# Patient Record
Sex: Female | Born: 1975 | Hispanic: Yes | Marital: Single | Smoking: Never smoker
Health system: Southern US, Community
[De-identification: ages and names within clinical notes are randomized; demographics above are authoritative.]

## PROBLEM LIST (undated history)

## (undated) DIAGNOSIS — Z789 Other specified health status: Secondary | ICD-10-CM

---

## 1997-09-18 HISTORY — PX: ANKLE SURGERY: SHX546

## 2008-09-18 HISTORY — PX: RHINOPLASTY: SHX2354

## 2011-09-19 HISTORY — PX: OTHER SURGICAL HISTORY: SHX169

## 2013-09-18 NOTE — L&D Delivery Note (Signed)
Delivery Note At 5:40 AM a viable female was delivered via Vaginal, Spontaneous Delivery (Presentation: Right Occiput Anterior).  APGAR: 8, 9; weight .   Placenta status: Intact, Spontaneous.  Cord: 3 vessels with the following complications: None.  Cord pH: N/A  Anesthesia: Epidural  Episiotomy: None Lacerations: 2nd degree Suture Repair: 3.0 vicryl Est. Blood Loss (mL): 300  Mom to postpartum.  Baby to Couplet care / Skin to Skin.  NSVD female delivered over a 2nd degree perineal tear without other complications. Cord blood was collected for banking. Third stage of delivery was managed actively, with pitocin and traction. Her laceration was repaired in the usual fashion with 3.0 vicryl.   [name], Emiel Kielty 11/11/2013, 6:45 AM

## 2013-09-18 NOTE — L&D Delivery Note (Signed)
I have seen the patient with the resident/student and agree with the above.  Hogan, Heather Donovan  

## 2013-10-10 ENCOUNTER — Other Ambulatory Visit: Payer: Self-pay

## 2013-10-10 DIAGNOSIS — Z3201 Encounter for pregnancy test, result positive: Secondary | ICD-10-CM

## 2013-10-10 LAB — POCT URINALYSIS DIP (DEVICE)
BILIRUBIN URINE: NEGATIVE
GLUCOSE, UA: NEGATIVE mg/dL
Hgb urine dipstick: NEGATIVE
KETONES UR: NEGATIVE mg/dL
LEUKOCYTES UA: NEGATIVE
NITRITE: NEGATIVE
Protein, ur: NEGATIVE mg/dL
Specific Gravity, Urine: 1.02 (ref 1.005–1.030)
Urobilinogen, UA: 0.2 mg/dL (ref 0.0–1.0)
pH: 6.5 (ref 5.0–8.0)

## 2013-10-11 LAB — OBSTETRIC PANEL
ANTIBODY SCREEN: NEGATIVE
BASOS ABS: 0 10*3/uL (ref 0.0–0.1)
BASOS PCT: 0 % (ref 0–1)
Eosinophils Absolute: 0.1 10*3/uL (ref 0.0–0.7)
Eosinophils Relative: 2 % (ref 0–5)
HCT: 40.1 % (ref 36.0–46.0)
HEP B S AG: NEGATIVE
Hemoglobin: 13.9 g/dL (ref 12.0–15.0)
LYMPHS PCT: 21 % (ref 12–46)
Lymphs Abs: 1.9 10*3/uL (ref 0.7–4.0)
MCH: 31.7 pg (ref 26.0–34.0)
MCHC: 34.7 g/dL (ref 30.0–36.0)
MCV: 91.3 fL (ref 78.0–100.0)
MONO ABS: 0.5 10*3/uL (ref 0.1–1.0)
MONOS PCT: 5 % (ref 3–12)
NEUTROS ABS: 6.3 10*3/uL (ref 1.7–7.7)
NEUTROS PCT: 72 % (ref 43–77)
Platelets: 261 10*3/uL (ref 150–400)
RBC: 4.39 MIL/uL (ref 3.87–5.11)
RDW: 13.8 % (ref 11.5–15.5)
RUBELLA: 16.9 {index} — AB (ref <0.90)
Rh Type: POSITIVE
WBC: 8.8 10*3/uL (ref 4.0–10.5)

## 2013-10-11 LAB — HIV ANTIBODY (ROUTINE TESTING W REFLEX): HIV: NONREACTIVE

## 2013-10-11 LAB — GLUCOSE TOLERANCE, 1 HOUR (50G) W/O FASTING: GLUCOSE 1 HOUR GTT: 130 mg/dL (ref 70–140)

## 2013-10-13 LAB — PRESCRIPTION MONITORING PROFILE (19 PANEL)
AMPHETAMINE/METH: NEGATIVE ng/mL
BUPRENORPHINE, URINE: NEGATIVE ng/mL
Barbiturate Screen, Urine: NEGATIVE ng/mL
Benzodiazepine Screen, Urine: NEGATIVE ng/mL
CANNABINOID SCRN UR: NEGATIVE ng/mL
CARISOPRODOL, URINE: NEGATIVE ng/mL
COCAINE METABOLITES: NEGATIVE ng/mL
CREATININE, URINE: 136.14 mg/dL (ref >20.0)
ECSTASY: NEGATIVE ng/mL
FENTANYL URINE: NEGATIVE ng/mL
MEPERIDINE UR: NEGATIVE ng/mL
METHADONE SCREEN, URINE: NEGATIVE ng/mL
METHAQUALONE SCREEN (URINE): NEGATIVE ng/mL
Nitrites, Initial: NEGATIVE ug/mL
OXYCODONE SCRN UR: NEGATIVE ng/mL
Opiate Screen, Urine: NEGATIVE ng/mL
PHENCYCLIDINE, UR: NEGATIVE ng/mL
Propoxyphene: NEGATIVE ng/mL
Tapentadol, urine: NEGATIVE ng/mL
Tramadol Scrn, Ur: NEGATIVE ng/mL
Zolpidem, Urine: NEGATIVE ng/mL
pH, Initial: 6.3 pH (ref 4.5–8.9)

## 2013-10-14 ENCOUNTER — Emergency Department (HOSPITAL_COMMUNITY)
Admission: EM | Admit: 2013-10-14 | Discharge: 2013-10-14 | Disposition: A | Discharge status: Home / Self Care | Payer: 59 | Source: Home / Self Care | Attending: Family Medicine | Admitting: Family Medicine

## 2013-10-14 ENCOUNTER — Encounter (HOSPITAL_COMMUNITY): Payer: Self-pay | Admitting: Emergency Medicine

## 2013-10-14 DIAGNOSIS — J069 Acute upper respiratory infection, unspecified: Secondary | ICD-10-CM

## 2013-10-14 DIAGNOSIS — B9789 Other viral agents as the cause of diseases classified elsewhere: Principal | ICD-10-CM

## 2013-10-14 LAB — CULTURE, OB URINE: Colony Count: >=100000

## 2013-10-14 LAB — HEMOGLOBINOPATHY EVALUATION
Hemoglobin Other: 0 %
Hgb A2 Quant: 2.6 % (ref 2.2–3.2)
Hgb A: 96.9 % (ref 96.8–97.8)
Hgb F Quant: 0.5 % (ref 0.0–2.0)
Hgb S Quant: 0 %

## 2013-10-14 LAB — POCT RAPID STREP A: STREPTOCOCCUS, GROUP A SCREEN (DIRECT): NEGATIVE

## 2013-10-14 MED ORDER — HYDROCODONE-ACETAMINOPHEN 5-325 MG PO TABS: 0.5000 | ORAL_TABLET | Freq: Every evening | ORAL | Status: DC | PRN

## 2013-10-14 NOTE — ED Provider Notes (Signed)
Sydney Lam is a 38 y.o. female who presents to Urgent Care today for 2 days of sore throat coughing congestion. Patient denies any significant shortness of breath nausea vomiting or diarrhea. She is currently about 36 weeks in her pregnancy. She is from [place] and got the majority of her prenatal care in [place]. [facility] February 9th.  She states that her prenatal care was otherwise unremarkable until now.  She feels well otherwise. She has tried only honey with women for her cough which has not helped much.   History reviewed. No pertinent past medical history. History  Substance Use Topics  . Smoking status: Not on file  . Smokeless tobacco: Never Used  . Alcohol Use: No   ROS as above Medications: No current facility-administered medications for this encounter.   Current Outpatient Prescriptions  Medication Sig Dispense Refill  . HYDROcodone-acetaminophen (NORCO/VICODIN) 5-325 MG per tablet Take 0.5 tablets by mouth at bedtime as needed (cough).  6 tablet  0    Exam:  BP 122/67  Pulse 78  Temp(Src) 98.7 F (37.1 C) (Oral)  Resp 16  Wt 158 lb 11.7 oz (72 kg)  SpO2 97% Gen: Well NAD HEENT: EOMI,  MMM, posterior pharynx is mildly erythematous. Tympanic membranes are normal appearing bilaterally Lungs: Normal work of breathing. CTABL Heart: RRR no MRG Abd: NABS, Soft. NT, gravid Exts: Brisk capillary refill, warm and well perfused.   Results for orders placed during the hospital encounter of 10/14/13 (from the past 24 hour(s))  POCT RAPID STREP A ([facility] URG CARE ONLY)     Status: None   Collection Time    10/14/13 12:48 PM      Result Value Range   Streptococcus, Group A Screen (Direct) NEGATIVE  NEGATIVE   No results found.  Assessment and Plan: 38 y.o. female with viral URI with cough. Plan to treat with Tylenol and hydrocodone his cough medication. Continue prenatal vitamins. [facility].  Discussed warning signs or symptoms. Please see discharge instructions. Patient expresses understanding.    Rodolph BongEvan S Stancil Deisher, MD 10/14/13 [phone]

## 2013-10-14 NOTE — ED Notes (Signed)
Pt reports  Symptoms  Of  sorethroat       With  Pain  When  She  Swallows   Also  Has  A  Non   productice cough  As  Well     The pt    Is  Pregnant  No  Pregnancy  Related   Issues

## 2013-10-14 NOTE — Discharge Instructions (Signed)
Gricias por venir hoy.  Take 1/2 tablet of norco at bedtime for cough as needed.  Use tylenol for fever, chills or sore throat.  Call or go to the emergency room if you get worse, have trouble breathing, have chest pains, or palpitations.

## 2013-10-15 ENCOUNTER — Telehealth: Payer: Self-pay | Admitting: *Deleted

## 2013-10-15 NOTE — Telephone Encounter (Addendum)
Message copied by Jill SideAY, Tilly Pernice L on Wed Oct 15, 2013  8:12 AM ------      Message from: Adam PhenixARNOLD, JAMES G      Created: Tue Oct 14, 2013  6:19 PM       UTI, Rx Macrobid 100 mg BID 7 days  ------ I called pt with [employer] [facility] [phone] and left message that her recent urine test shows a bladder infection. [facility]. If she has questions, she may call back on Monday.

## 2013-10-16 LAB — CULTURE, GROUP A STREP

## 2013-10-17 MED ORDER — NITROFURANTOIN MONOHYD MACRO 100 MG PO CAPS: 100.0000 mg | ORAL_CAPSULE | Freq: Two times a day (BID) | ORAL | Status: DC

## 2013-10-20 ENCOUNTER — Encounter: Payer: Self-pay | Admitting: *Deleted

## 2013-10-27 ENCOUNTER — Encounter: Payer: Self-pay | Admitting: Obstetrics & Gynecology

## 2013-10-27 ENCOUNTER — Ambulatory Visit (INDEPENDENT_AMBULATORY_CARE_PROVIDER_SITE_OTHER): Payer: 59 | Admitting: Obstetrics & Gynecology

## 2013-10-27 VITALS — BP 119/73 | Temp 97.8°F | Ht 63.0 in

## 2013-10-27 DIAGNOSIS — Z3400 Encounter for supervision of normal first pregnancy, unspecified trimester: Secondary | ICD-10-CM | POA: Insufficient documentation

## 2013-10-27 DIAGNOSIS — Z23 Encounter for immunization: Secondary | ICD-10-CM

## 2013-10-27 LAB — POCT URINALYSIS DIP (DEVICE)
BILIRUBIN URINE: NEGATIVE
Glucose, UA: NEGATIVE mg/dL
Ketones, ur: NEGATIVE mg/dL
Nitrite: NEGATIVE
PH: 7 (ref 5.0–8.0)
Protein, ur: NEGATIVE mg/dL
Specific Gravity, Urine: 1.02 (ref 1.005–1.030)
Urobilinogen, UA: 0.2 mg/dL (ref 0.0–1.0)

## 2013-10-27 LAB — OB RESULTS CONSOLE GC/CHLAMYDIA
CHLAMYDIA, DNA PROBE: NEGATIVE
Gonorrhea: NEGATIVE

## 2013-10-27 LAB — OB RESULTS CONSOLE GBS: GBS: NEGATIVE

## 2013-10-27 MED ORDER — TETANUS-DIPHTH-ACELL PERTUSSIS 5-2.5-18.5 LF-MCG/0.5 IM SUSP: 0.5000 mL | Freq: Once | INTRAMUSCULAR | Status: DC

## 2013-10-27 NOTE — Patient Instructions (Signed)
Parto vaginal (Vaginal Delivery) Durante el parto, el mdico la ayudar a dar a luz a su beb. En el parto vaginal, deber pujar para que el beb salga por la vagina. Sin embargo, antes de que pueda sacar al beb, es necesario que ocurran ciertas cosas. La abertura del tero (cuello del tero) tiene que ablandarse, hacerse ms delgado y abrirse (dilatar) hasta que llegue a 10 cm. Adems, el beb tiene que bajar desde el tero a la vagina.  SIGNOS DE TRABAJO DE PARTO  El mdico tendr primero que asegurarse de que usted est en trabajo de parto. Algunos signos son:   Eliminar lo que se llama tapn mucoso antes del inicio del trabajo de parto. Este es una pequea cantidad de mucosidad teida con sangre.  Tener contracciones uterinas regulares y dolorosas.  El tiempo entre las contracciones debe acortarse.  Las molestias y el dolor se harn ms intensos gradualmente.  El dolor de las contracciones empeora al caminar y no se alivia con el reposo.  El cuello del tero se hace mas delgado (se borra) y se dilata. ANTES DEL PARTO Una vez que se inicie el trabajo de parto y sea admitida en el hospital o sanatorio, el mdico podr hacer lo siguiente:   Realizar un examen fsico.  Controlar si hay complicaciones relacionadas con el trabajo de parto.  Verificar su presin arterial, temperatura y pulso y la frecuencia cardaca (signos vitales).  Determinar si se ha roto el saco amnitico y cundo ha ocurrido.  Realizar un examen vaginal (utilizando un guante estril y un lubricante) para determinar:  La posicin (presentacin) del beb. El beb se presenta con la cabeza primero (vertex) en el canal de parto (vagina), o estn los pies o las nalgas primero (de nalgas)?  El nivel (estacin) de la cabeza del beb dentro del canal de parto.  El borramiento y la dilatacin del cuello uterino.  El monitor fetal electrnico generalmente se coloca sobre el abdomen al llegar. Se utiliza para controlar  las contracciones y la frecuencia cardaca del beb.  Cuando el monitor est en el abdomen (monitor fetal externo), slo toma la frecuencia y la duracin de las contracciones. No informa acerca de la intensidad de las contracciones.  Si el mdico necesita saber exactamente la intensidad de las contracciones o cul es la frecuencia cardaca del beb, colocar un monitor interno en la vagina y el tero. El mdico comentar los riesgos y los beneficios de usar un monitor interno y le pedir autorizacin antes de colocar el dispositivo.  El monitoreo fetal continuo ser necesario si le han aplicado una epidural, si le administran ciertos medicamentos (como oxitocina) y si tiene complicaciones del embarazo o del trabajo de parto.  Podrn colocarle una va intravenosa en una vena del brazo para suministrarle lquidos y medicamentos, si es necesario. TRES ETAPAS DEL TRABAJO DE PARTO Y EL PARTO El trabajo de parto y el parto normales se dividen en tres etapas. Primera etapa Esta etapa comienza cuando comienzan las contracciones regulares y el cuello comienza a borrarse y dilatarse. Finaliza cuando el cuello est completamente abierto (completamente dilatado). La primera etapa es la etapa ms larga del trabajo de parto y puede durar desde 3 horas a 15 horas.  Algunos mtodos estn disponibles para ayudar con el dolor del parto. Usted y su mdico decidirn qu opcin es la mejor para usted. Las opciones incluyen:   Medicamentos narcticos. Estos son medicamentos fuertes que usted puede recibir a travs de una va intravenosa o como   inyeccin en el msculo. Estos medicamentos alivian el dolor pero no hacen que desaparezca completamente.  Epidural. Se administra un medicamento a travs de un tubo delgado que se inserta en la espalda. El medicamento adormece la parte inferior del cuerpo y evita el dolor en esa zona.  Bloqueo paracervical Es una inyeccin de un anestsico en cada lado del cuello  uterino.  Usted podr pedir un parto natural, que implica que no se usen analgsicos ni epidural durante el parto y el trabajo de parto. En cambio, podr tener otro tipo de ayuda como ejercicios respiratorios para hacer frente al dolor. Segunda etapa La segunda etapa del trabajo de parto comienza cuando el cuello se ha dilatado completamente a 10 cm. Contina hasta que usted puja al beb hacia abajo, por el canal de parto, y el beb nace. Esta etapa puede durar slo algunos minutos o algunas horas.  La posicin del la cabeza del beb a medida que pasa por el canal de parto, es informada como un nmero, llamado estacin. Si la cabeza del beb no ha iniciado su descenso, la estacin se describe como que est en menos 3 ( 3). Cuando la cabeza del beb est en la estacin cero, est en el medio del canal de parto y se encaja en la pelvis. La estacin en la que se encuentra el beb indica el progreso de la segunda etapa del trabajo de parto.  Cuando el beb nace, el mdico lo sostendr con la cabeza hacia abajo para evitar que el lquido amnitico, el moco y la sangre entren en los pulmones del beb. La boca y la nariz del beb podrn ser succionadas con un pequeo bulbo para retirar todo lquido adicional.  El mdico podr colocar al beb sobre su estmago. Es importante evitar que el beb tome fro. Para hacerlo, el mdico secar al beb, lo colocar directamente sobre su piel, (sin mantas entre usted y el beb) y lo cubrir con mantas secas y tibias.  Se corta el cordn umbilical. Tercera etapa Durante la tercera etapa del trabajo de parto, el mdico sacar la placenta (alumbramiento) y se asegurar de que el sangrado est controlado. La salida de la placenta generalmente demora 5 minutos pero puede tardar hasta 30 minutos. Luego de la salida de la placenta, le darn un medicamento por va intravenosa o inyectable para ayudar a contraer el tero y controlar el sangrado. Si planea amamantar al beb, puede  intentar en este momento. Luego de la salida de la placenta, el tero debe contraerse y quedar muy firme. Si el tero no queda firme, el mdico lo masajear. Esto es importante debido a que la contraccin del tero ayuda a cortar el sangrado en el sitio en que la placenta estaba unida al tero. Si el tero no se contrae adecuadamente ni permanece firme, podr causar un sangrado abundante. Si hay mucho sangrado, podrn darle medicamentos para contraer el tero y detener el sangrado.  Document Released: 08/17/2008 Document Revised: 05/07/2013 [employer] Patient Information 2014 [employer].  

## 2013-10-27 NOTE — Progress Notes (Signed)
Transfer care from [place], had prenatal screening and US nl. No complaints today, exam normal, CT and GC and GBS done today.

## 2013-10-27 NOTE — Progress Notes (Signed)
Pulse- 63 Weight gain of 25-35lbs New ob packet given

## 2013-10-28 LAB — GC/CHLAMYDIA PROBE AMP
CT Probe RNA: NEGATIVE
GC Probe RNA: NEGATIVE

## 2013-10-29 LAB — CULTURE, BETA STREP (GROUP B ONLY)

## 2013-11-05 ENCOUNTER — Ambulatory Visit (INDEPENDENT_AMBULATORY_CARE_PROVIDER_SITE_OTHER): Payer: Managed Care, Other (non HMO) | Admitting: Advanced Practice Midwife

## 2013-11-05 VITALS — BP 111/77 | Temp 97.3°F | Wt 171.0 lb

## 2013-11-05 DIAGNOSIS — Z3400 Encounter for supervision of normal first pregnancy, unspecified trimester: Secondary | ICD-10-CM

## 2013-11-05 LAB — POCT URINALYSIS DIP (DEVICE)
BILIRUBIN URINE: NEGATIVE
Glucose, UA: NEGATIVE mg/dL
HGB URINE DIPSTICK: NEGATIVE
Ketones, ur: NEGATIVE mg/dL
Leukocytes, UA: NEGATIVE
NITRITE: NEGATIVE
PROTEIN: NEGATIVE mg/dL
Specific Gravity, Urine: 1.01 (ref 1.005–1.030)
Urobilinogen, UA: 0.2 mg/dL (ref 0.0–1.0)
pH: 6.5 (ref 5.0–8.0)

## 2013-11-05 MED ORDER — PSEUDOEPHEDRINE HCL 60 MG PO TABS: 60.0000 mg | ORAL_TABLET | ORAL | Status: DC | PRN

## 2013-11-05 NOTE — Progress Notes (Signed)
Doing well.  Good fetal movement, denies vaginal bleeding, LOF, regular contractions.  Labor precautions given. Pt reports URI with stuffy nose x5 days.  Sudafed 60 mg Q4-6 hours PRN.

## 2013-11-05 NOTE — Progress Notes (Signed)
p=72

## 2013-11-09 ENCOUNTER — Inpatient Hospital Stay (HOSPITAL_COMMUNITY): Payer: Managed Care, Other (non HMO)

## 2013-11-09 ENCOUNTER — Inpatient Hospital Stay (HOSPITAL_COMMUNITY)
Admission: AD | Admit: 2013-11-09 | Discharge: 2013-11-12 | DRG: 775 | Disposition: A | Discharge status: Home / Self Care | Payer: Managed Care, Other (non HMO) | Source: Ambulatory Visit | Attending: Obstetrics and Gynecology | Admitting: Obstetrics and Gynecology

## 2013-11-09 ENCOUNTER — Encounter (HOSPITAL_COMMUNITY): Payer: Self-pay | Admitting: *Deleted

## 2013-11-09 DIAGNOSIS — O4103X0 Oligohydramnios, third trimester, not applicable or unspecified: Secondary | ICD-10-CM

## 2013-11-09 DIAGNOSIS — O4100X0 Oligohydramnios, unspecified trimester, not applicable or unspecified: Secondary | ICD-10-CM | POA: Diagnosis present

## 2013-11-09 DIAGNOSIS — O09529 Supervision of elderly multigravida, unspecified trimester: Secondary | ICD-10-CM | POA: Diagnosis present

## 2013-11-09 DIAGNOSIS — O368190 Decreased fetal movements, unspecified trimester, not applicable or unspecified: Principal | ICD-10-CM | POA: Diagnosis present

## 2013-11-09 HISTORY — DX: Other specified health status: Z78.9

## 2013-11-10 ENCOUNTER — Inpatient Hospital Stay (HOSPITAL_COMMUNITY): Payer: Managed Care, Other (non HMO) | Admitting: Anesthesiology

## 2013-11-10 ENCOUNTER — Encounter (HOSPITAL_COMMUNITY): Payer: Self-pay | Admitting: Family Medicine

## 2013-11-10 ENCOUNTER — Encounter (HOSPITAL_COMMUNITY): Payer: Managed Care, Other (non HMO) | Admitting: Anesthesiology

## 2013-11-10 DIAGNOSIS — O4100X0 Oligohydramnios, unspecified trimester, not applicable or unspecified: Secondary | ICD-10-CM

## 2013-11-10 DIAGNOSIS — O368190 Decreased fetal movements, unspecified trimester, not applicable or unspecified: Secondary | ICD-10-CM

## 2013-11-10 LAB — RPR: RPR: NONREACTIVE

## 2013-11-10 LAB — CBC
HCT: 36.8 % (ref 36.0–46.0)
HEMOGLOBIN: 13.3 g/dL (ref 12.0–15.0)
MCH: 32.4 pg (ref 26.0–34.0)
MCHC: 36.1 g/dL — ABNORMAL HIGH (ref 30.0–36.0)
MCV: 89.8 fL (ref 78.0–100.0)
Platelets: 180 10*3/uL (ref 150–400)
RBC: 4.1 MIL/uL (ref 3.87–5.11)
RDW: 12.9 % (ref 11.5–15.5)
WBC: 9.7 10*3/uL (ref 4.0–10.5)

## 2013-11-10 LAB — TYPE AND SCREEN
ABO/RH(D): O POS
Antibody Screen: NEGATIVE

## 2013-11-10 LAB — ABO/RH: ABO/RH(D): O POS

## 2013-11-10 MED ORDER — FENTANYL 2.5 MCG/ML BUPIVACAINE 1/10 % EPIDURAL INFUSION (WH - ANES)
14.0000 mL/h | INTRAMUSCULAR | Status: DC | PRN
Start: 2013-11-10 — End: 2013-11-11
  Filled 2013-11-10: qty 125

## 2013-11-10 MED ORDER — FENTANYL 2.5 MCG/ML BUPIVACAINE 1/10 % EPIDURAL INFUSION (WH - ANES)
INTRAMUSCULAR | Status: DC | PRN
  Administered 2013-11-10: 14 mL/h via EPIDURAL

## 2013-11-10 MED ORDER — EPHEDRINE 5 MG/ML INJ
10.0000 mg | INTRAVENOUS | Status: DC | PRN
  Filled 2013-11-10: qty 2

## 2013-11-10 MED ORDER — OXYTOCIN 40 UNITS IN LACTATED RINGERS INFUSION - SIMPLE MED
62.5000 mL/h | INTRAVENOUS | Status: DC
  Administered 2013-11-11: 62.5 mL/h via INTRAVENOUS
  Filled 2013-11-10: qty 1000

## 2013-11-10 MED ORDER — DIPHENHYDRAMINE HCL 50 MG/ML IJ SOLN: 12.5000 mg | INTRAMUSCULAR | Status: DC | PRN

## 2013-11-10 MED ORDER — SALINE SPRAY 0.65 % NA SOLN
1.0000 | NASAL | Status: DC | PRN
  Filled 2013-11-10: qty 44

## 2013-11-10 MED ORDER — LACTATED RINGERS IV SOLN
500.0000 mL | Freq: Once | INTRAVENOUS | Status: AC
  Administered 2013-11-10: 500 mL via INTRAVENOUS

## 2013-11-10 MED ORDER — LACTATED RINGERS IV SOLN
INTRAVENOUS | Status: DC
  Administered 2013-11-10 (×4): via INTRAVENOUS

## 2013-11-10 MED ORDER — PHENYLEPHRINE 40 MCG/ML (10ML) SYRINGE FOR IV PUSH (FOR BLOOD PRESSURE SUPPORT)
80.0000 ug | PREFILLED_SYRINGE | INTRAVENOUS | Status: DC | PRN
  Filled 2013-11-10: qty 2
  Filled 2013-11-10: qty 10

## 2013-11-10 MED ORDER — EPHEDRINE 5 MG/ML INJ
10.0000 mg | INTRAVENOUS | Status: DC | PRN
  Filled 2013-11-10: qty 2
  Filled 2013-11-10: qty 4

## 2013-11-10 MED ORDER — FAMOTIDINE 20 MG PO TABS
20.0000 mg | ORAL_TABLET | Freq: Two times a day (BID) | ORAL | Status: DC | PRN
  Administered 2013-11-10: 20 mg via ORAL
  Filled 2013-11-10: qty 1

## 2013-11-10 MED ORDER — LACTATED RINGERS IV SOLN: 500.0000 mL | INTRAVENOUS | Status: DC | PRN

## 2013-11-10 MED ORDER — CITRIC ACID-SODIUM CITRATE 334-500 MG/5ML PO SOLN
30.0000 mL | ORAL | Status: DC | PRN
Start: 2013-11-10 — End: 2013-11-11
  Administered 2013-11-11: 30 mL via ORAL
  Filled 2013-11-10: qty 15

## 2013-11-10 MED ORDER — IBUPROFEN 600 MG PO TABS
600.0000 mg | ORAL_TABLET | Freq: Four times a day (QID) | ORAL | Status: DC | PRN
  Administered 2013-11-11: 600 mg via ORAL
  Filled 2013-11-10: qty 1

## 2013-11-10 MED ORDER — LIDOCAINE HCL (PF) 1 % IJ SOLN
30.0000 mL | INTRAMUSCULAR | Status: DC | PRN
  Filled 2013-11-10: qty 30

## 2013-11-10 MED ORDER — PSEUDOEPHEDRINE HCL 30 MG PO TABS
30.0000 mg | ORAL_TABLET | Freq: Four times a day (QID) | ORAL | Status: DC | PRN
  Administered 2013-11-10: 30 mg via ORAL
  Filled 2013-11-10: qty 1

## 2013-11-10 MED ORDER — TERBUTALINE SULFATE 1 MG/ML IJ SOLN: 0.2500 mg | Freq: Once | INTRAMUSCULAR | Status: AC | PRN

## 2013-11-10 MED ORDER — LIDOCAINE HCL (PF) 1 % IJ SOLN
INTRAMUSCULAR | Status: DC | PRN
  Administered 2013-11-10 (×2): 4 mL

## 2013-11-10 MED ORDER — FENTANYL CITRATE 0.05 MG/ML IJ SOLN
50.0000 ug | INTRAMUSCULAR | Status: DC | PRN
  Administered 2013-11-10 (×3): 50 ug via INTRAVENOUS
  Filled 2013-11-10 (×2): qty 2

## 2013-11-10 MED ORDER — ACETAMINOPHEN 325 MG PO TABS: 650.0000 mg | ORAL_TABLET | ORAL | Status: DC | PRN

## 2013-11-10 MED ORDER — FENTANYL CITRATE 0.05 MG/ML IJ SOLN
100.0000 ug | INTRAMUSCULAR | Status: DC | PRN
  Administered 2013-11-10: 100 ug via INTRAVENOUS
  Filled 2013-11-10 (×2): qty 2

## 2013-11-10 MED ORDER — ONDANSETRON HCL 4 MG/2ML IJ SOLN
4.0000 mg | Freq: Four times a day (QID) | INTRAMUSCULAR | Status: DC | PRN
  Administered 2013-11-10: 4 mg via INTRAVENOUS
  Filled 2013-11-10: qty 2

## 2013-11-10 MED ORDER — OXYTOCIN BOLUS FROM INFUSION
500.0000 mL | INTRAVENOUS | Status: DC
  Administered 2013-11-11: 500 mL via INTRAVENOUS

## 2013-11-10 MED ORDER — OXYCODONE-ACETAMINOPHEN 5-325 MG PO TABS: 1.0000 | ORAL_TABLET | ORAL | Status: DC | PRN

## 2013-11-10 MED ORDER — OXYTOCIN 40 UNITS IN LACTATED RINGERS INFUSION - SIMPLE MED
1.0000 m[IU]/min | INTRAVENOUS | Status: DC
  Administered 2013-11-10: 2 m[IU]/min via INTRAVENOUS
  Filled 2013-11-10: qty 1000

## 2013-11-10 MED ORDER — PHENYLEPHRINE 40 MCG/ML (10ML) SYRINGE FOR IV PUSH (FOR BLOOD PRESSURE SUPPORT)
80.0000 ug | PREFILLED_SYRINGE | INTRAVENOUS | Status: DC | PRN
  Filled 2013-11-10: qty 2

## 2013-11-10 NOTE — Progress Notes (Signed)
Sydney Lam is a 38 y.o. G2P0010 at 7568w6d by LMP admitted for induction of labor due to oligohydramnios.  Subjective: Notes worsening contractions, now rates them 6/10. Fentanyl helping.   Objective: BP 118/72  Pulse 67  Temp(Src) 97.6 F (36.4 C) (Oral)  Resp 16  Ht 5\' 3"  (1.6 m)  Wt 78.019 kg (172 lb)  BMI 30.48 kg/m2  LMP 02/04/2013      FHT:  FHR: 120 bpm, variability: moderate,  accelerations:  Present,  decelerations:  Absent UC:   irregular, every 2-6 minutes SVE:   Dilation: 1 Effacement (%): 20 Station: -3 Exam by:: Dr. Ike Benedom  Labs: Lab Results  Component Value Date   WBC 9.7 11/10/2013   HGB 13.3 11/10/2013   HCT 36.8 11/10/2013   MCV 89.8 11/10/2013   PLT 180 11/10/2013    Assessment / Plan: Induction of labor due to oligohydramnios  Labor: Inducing, foley bulb still in place, having contractions so no pit yet, Contractions mostly q 2-3 minutes Preeclampsia:  n/a Fetal Wellbeing:  Category I Pain Control:  Fentanyl I/D:  GBS neg Anticipated MOD:  NSVD  [name], Samuel 11/10/2013, 5:40 AM  I spoke with and examined patient and agree with resident's note and plan of care.  Tawana ScaleMichael Ryan Elizabet Schweppe, MD OB Fellow 11/10/2013 9:06 AM

## 2013-11-10 NOTE — Progress Notes (Signed)
Sydney Lam is a 38 y.o. G2P0010 at 7611w6d  admitted for induction of labor due to Low amniotic fluid..  Subjective:   Objective: BP 120/55  Pulse 63  Temp(Src) 98 F (36.7 C) (Oral)  Resp 18  Ht 5\' 3"  (1.6 m)  Wt 78.019 kg (172 lb)  BMI 30.48 kg/m2  LMP 02/04/2013      FHT:  FHR: 145 bpm, variability: moderate,  accelerations:  Present,  decelerations:  Absent UC:   irregular, every 6-4 minutes SVE:   Dilation: 5 Effacement (%): 80 Station: -2 Exam by:: State FarmHeather Hogan  Labs: Lab Results  Component Value Date   WBC 9.7 11/10/2013   HGB 13.3 11/10/2013   HCT 36.8 11/10/2013   MCV 89.8 11/10/2013   PLT 180 11/10/2013    Assessment / Plan: Induction of labor due to oligohydramnios ,  progressing well on pitocin  Labor: Progressing on Pitocin, will continue to increase then AROM Preeclampsia:  no signs or symptoms of toxicity Fetal Wellbeing:  Category I Pain Control:  Labor support without medications I/D:  n/a Anticipated MOD:  NSVD  Tawnya CrookHogan, Heather Donovan 11/10/2013, 9:46 PM

## 2013-11-10 NOTE — Progress Notes (Addendum)
Patient ID: Sydney Lam, female   DOB: [birth year], 38 y.o.   MRN: [id number] Sydney Lam is a 38 y.o. G2P0010 at 3684w6d admitted for IOL indicated by oligohydramnios, AFI 4.9, s/p FB and on pitocin at 10 mu/hr  Subjective: Comfortable. UCs mild.  Objective: BP 106/57  Pulse 70  Temp(Src) 97.9 F (36.6 C) (Axillary)  Resp 16  Ht 5\' 3"  (1.6 m)  Wt 78.019 kg (172 lb)  BMI 30.48 kg/m2  LMP 02/04/2013  Fetal Heart FHR: 125-130 bpm, variability: moderate,  accelerations:  Present,  decelerations:  Absent   Contractions: q 4-7 min  SVE:   Dilation: 4.5 Effacement (%): 60 Station: -3 Exam by:: Elana AlmElizabeth Cone RNC SVE: post 4/50/-3 intact, vtx  Assessment / Plan:  Labor: latent, s/p FB> titrate pitocin to UCs, continue to increase Fetal Wellbeing: Cat 1 Pain Control:  n/a Expected mode of delivery: NSVD  Carrissa Taitano 11/10/2013, 12:57 PM

## 2013-11-10 NOTE — Progress Notes (Signed)
Patient ID: Monica Martinezatricia Abigail Hernandez-Sandoval, female   DOB: June 18, 1976, 38 y.o.   MRN: [id number] Monica Martinezatricia Abigail Hernandez-Sandoval is a 38 y.o. G2P0010 at 5739w6d admitted for IOL due to oligohydramnios  Subjective: Comfortable, coping with uncomfortable UCs.  Objective: BP 136/63  Pulse 66  Temp(Src) 98.8 F (37.1 C) (Axillary)  Resp 16  Ht 5\' 3"  (1.6 m)  Wt 78.019 kg (172 lb)  BMI 30.48 kg/m2  LMP 02/04/2013  Fetal Heart FHR: 145 bpm, variability: moderate,  accelerations:  Present,  decelerations:  Absent   Contractions: q 2-3, more regular for past 1 hr, pitocin at 20 mu  SVE:   Dilation: 4 Effacement (%): 60 Station: -3 Exam by:: Tausha Milhoan CNM Cx still posterior thick Assessment / Plan:  Labor: latent phase Fetal Wellbeing: Cat 1 Pain Control:  Declines analgesia Expected mode of delivery: NSVD  Elmor Kost 11/10/2013, 5:38 PM

## 2013-11-10 NOTE — Anesthesia Preprocedure Evaluation (Signed)
Anesthesia Evaluation  Patient identified by MRN, date of birth, ID band Patient awake    Reviewed: Allergy & Precautions, H&P , Patient's Chart, lab work & pertinent test results  Airway Mallampati: III TM Distance: >3 FB Neck ROM: Full    Dental no notable dental hx. (+) Teeth Intact   Pulmonary neg pulmonary ROS,  breath sounds clear to auscultation  Pulmonary exam normal       Cardiovascular negative cardio ROS  Rhythm:Regular Rate:Normal     Neuro/Psych negative neurological ROS  negative psych ROS   GI/Hepatic Neg liver ROS, GERD-  ,  Endo/Other  Obesity  Renal/GU negative Renal ROS  negative genitourinary   Musculoskeletal negative musculoskeletal ROS (+)   Abdominal (+) + obese,   Peds  Hematology negative hematology ROS (+)   Anesthesia Other Findings   Reproductive/Obstetrics Oligohydramnios                           Anesthesia Physical Anesthesia Plan  ASA: II  Anesthesia Plan: Epidural   Post-op Pain Management:    Induction:   Airway Management Planned: Natural Airway  Additional Equipment:   Intra-op Plan:   Post-operative Plan:   Informed Consent: I have reviewed the patients History and Physical, chart, labs and discussed the procedure including the risks, benefits and alternatives for the proposed anesthesia with the patient or authorized representative who has indicated his/her understanding and acceptance.     Plan Discussed with: Anesthesiologist  Anesthesia Plan Comments:         Anesthesia Quick Evaluation

## 2013-11-10 NOTE — H&P (Signed)
Sydney Lam is a 38 y.o. female presenting for decreased fetal movement  for 4 hours. Since she came into the MAU the baby has begun to move like normal. She denies contractions, vaginal bleeding, or change in vaginal discharge. She denies any problems with this pregnancy or other complaints.   History OB History   Grav Para Term Preterm Abortions TAB SAB Ect Mult Living   2 0   1 1         Past Medical History  Diagnosis Date  . Medical history non-contributory    Past Surgical History  Procedure Laterality Date  . Rhinoplasty  2010  . Knee surgery  17 years ago   Family History: family history includes Mental retardation in her sister. Social History:  reports that she has never smoked. She has never used smokeless tobacco. She reports that she does not drink alcohol or use illicit drugs.   Prenatal Transfer Tool  Maternal Diabetes: No Genetic Screening: Declined Maternal Ultrasounds/Referrals: Normal Fetal Ultrasounds or other Referrals:  None Maternal Substance Abuse:  No Significant Maternal Medications:  None Significant Maternal Lab Results:  None Other Comments:  None   [facility]  Dating LMP:   02/04/14              Ultrasound:  28 weeks        Ultrasound consistent with LMP: Yes  Genetic Screen 1 Screen:         NT normal        AFP:                    Quad:                  NIPS:  Anatomic US Normal size equal to dates on US today, 32nd pecentile  GTT Early:       normal        Third trimester: 130  TDaP vaccine  10/27/13  Flu vaccine recd  GBS  negative  Baby Food  Breastfeeding  Contraception undecided  Circumcision female  Pediatrician   Support Person Spouse and mother     ROS Per HPI  Dilation: 1 Effacement (%): 20 Station: -3 Exam by:: Dr. Ike BeneDOM Blood pressure 116/77, pulse 72, temperature 97.7 F (36.5 C), temperature source Oral, resp. rate 16, height 5' 3.78" (1.62 m), weight 78.382 kg (172 lb 12.8 oz), last menstrual  period 02/04/2013. Exam Physical Exam   Gen: NAD, alert, cooperative with exam HEENT: NCAT, EOMI, PERRL CV: RRR, good S1/S2, no murmur Resp: CTABL, no wheezes, non-labored Abd: Soft gravid abdomen, non tender to palpation Ext: No edema, warm Neuro: Alert and oriented, No gross deficits  Dilation: 1 Effacement (%): 20 Station: -3 Exam by:: Dr. Ike BeneDOM  FHT: baseline 130, moderate variability, positive accels, single variable lasting for 1 minute resolved Toco: irreg 10 + minutes  Prenatal labs: ABO, Rh: O/POS/-- (01/23 1138) Antibody: NEG (01/23 1138) Rubella: 16.90 (01/23 1138) RPR: NON REAC (01/23 1138)  HBsAg: NEGATIVE (01/23 1138)  HIV: NON REACTIVE (01/23 1138)  GBS: Negative (02/09 0000)   Assessment/Plan: 38 y/o G2P0010 at 39.5 here with decreased fetal movement. An initial bedside US shows a vertex fetus with AFI 5.4. Formal BPP with AFI of 4.9.  Will induce labor considering oligohydramnios and term pregnancy, concur with MFM recommendations.  #Labor: inducing, Foley bulb and pitocin #Pain: IV pain meds until active, Epidural  #FWB: Category 1 with single variable decel with oligohydramnios #ID:  GBS neg #MOF: Breast #MOC:Undecided   Kevin Fenton 11/10/2013, 12:57 AM  I spoke with and examined patient and agree with resident's note and plan of care.  Tawana Scale, MD OB Fellow 11/10/2013 2:20 AM

## 2013-11-10 NOTE — Anesthesia Procedure Notes (Signed)
Epidural Patient location during procedure: OB Start time: 11/10/2013 11:50 PM  Staffing Anesthesiologist: Janelie Goltz A. Performed by: anesthesiologist   Preanesthetic Checklist Completed: patient identified, site marked, surgical consent, pre-op evaluation, timeout performed, IV checked, risks and benefits discussed and monitors and equipment checked  Epidural Patient position: sitting Prep: site prepped and draped and DuraPrep Patient monitoring: continuous pulse ox and blood pressure Approach: midline Injection technique: LOR air  Needle:  Needle type: Tuohy  Needle gauge: 17 G Needle length: 9 cm and 9 Needle insertion depth: 5 cm cm Catheter type: closed end flexible Catheter size: 19 Gauge Catheter at skin depth: 10 cm Test dose: negative and Other  Assessment Events: blood not aspirated, injection not painful, no injection resistance, negative IV test and no paresthesia  Additional Notes Patient identified. Risks and benefits discussed including failed block, incomplete  Pain control, post dural puncture headache, nerve damage, paralysis, blood pressure Changes, nausea, vomiting, reactions to medications-both toxic and allergic and post Partum back pain. All questions were answered. Patient expressed understanding and wished to proceed. Sterile technique was used throughout procedure. Epidural site was Dressed with sterile barrier dressing. No paresthesias, signs of intravascular injection Or signs of intrathecal spread were encountered.  Patient was more comfortable after the epidural was dosed. Please see RN's note for documentation of vital signs and FHR which are stable.

## 2013-11-10 NOTE — Progress Notes (Signed)
Sydney Lam is a 38 y.o. G2P0010 at 1935w6d by LMP admitted for induction of labor due to oligohydramnios.  Subjective: No new complaints, some cramping/contractions after foley bulb was placed.    Objective: BP 131/69  Pulse 67  Temp(Src) 97.6 F (36.4 C) (Oral)  Resp 18  Ht 5\' 3"  (1.6 m)  Wt 78.019 kg (172 lb)  BMI 30.48 kg/m2  LMP 02/04/2013      FHT:  FHR: 130 bpm, variability: moderate,  accelerations:  Present,  decelerations:  Absent UC:   irregular, every 2-5 minutes SVE:   Dilation: 1 Effacement (%): 20 Station: -3 Exam by:: Dr. Ike Lam  Labs: Lab Results  Component Value Date   WBC 9.7 11/10/2013   HGB 13.3 11/10/2013   HCT 36.8 11/10/2013   MCV 89.8 11/10/2013   PLT 180 11/10/2013    Assessment / Plan: Induction of labor due to oligohydramnios  Labor: Inducing, placed foley bulb, will start pitocin if contrations slow.  Preeclampsia:  n/a Fetal Wellbeing:  Category I Pain Control:  Fentanyl I/D:  GBS neg Anticipated MOD:  NSVD  Sydney Lam, Sydney Lam 11/10/2013, 2:21 AM  I spoke with and examined patient and agree with resident's note and plan of care.  Sydney ScaleMichael Ryan Devin Ganaway, MD OB Fellow 11/10/2013 9:06 AM

## 2013-11-11 ENCOUNTER — Encounter (HOSPITAL_COMMUNITY): Payer: Self-pay | Admitting: General Practice

## 2013-11-11 MED ORDER — SIMETHICONE 80 MG PO CHEW: 80.0000 mg | CHEWABLE_TABLET | ORAL | Status: DC | PRN

## 2013-11-11 MED ORDER — ONDANSETRON HCL 4 MG PO TABS: 4.0000 mg | ORAL_TABLET | ORAL | Status: DC | PRN

## 2013-11-11 MED ORDER — OXYCODONE-ACETAMINOPHEN 5-325 MG PO TABS
1.0000 | ORAL_TABLET | ORAL | Status: DC | PRN
  Administered 2013-11-11 – 2013-11-12 (×3): 1 via ORAL
  Filled 2013-11-11 (×3): qty 1

## 2013-11-11 MED ORDER — IBUPROFEN 600 MG PO TABS
600.0000 mg | ORAL_TABLET | Freq: Four times a day (QID) | ORAL | Status: DC
  Administered 2013-11-11 – 2013-11-12 (×6): 600 mg via ORAL
  Filled 2013-11-11 (×5): qty 1

## 2013-11-11 MED ORDER — DIBUCAINE 1 % RE OINT: 1.0000 "application " | TOPICAL_OINTMENT | RECTAL | Status: DC | PRN

## 2013-11-11 MED ORDER — ONDANSETRON HCL 4 MG/2ML IJ SOLN: 4.0000 mg | INTRAMUSCULAR | Status: DC | PRN

## 2013-11-11 MED ORDER — ZOLPIDEM TARTRATE 5 MG PO TABS: 5.0000 mg | ORAL_TABLET | Freq: Every evening | ORAL | Status: DC | PRN

## 2013-11-11 MED ORDER — BENZOCAINE-MENTHOL 20-0.5 % EX AERO: 1.0000 "application " | INHALATION_SPRAY | CUTANEOUS | Status: DC | PRN

## 2013-11-11 MED ORDER — SENNOSIDES-DOCUSATE SODIUM 8.6-50 MG PO TABS
2.0000 | ORAL_TABLET | ORAL | Status: DC
  Administered 2013-11-12: 2 via ORAL
  Filled 2013-11-11: qty 2

## 2013-11-11 MED ORDER — WITCH HAZEL-GLYCERIN EX PADS: 1.0000 "application " | MEDICATED_PAD | CUTANEOUS | Status: DC | PRN

## 2013-11-11 MED ORDER — DIPHENHYDRAMINE HCL 25 MG PO CAPS: 25.0000 mg | ORAL_CAPSULE | Freq: Four times a day (QID) | ORAL | Status: DC | PRN

## 2013-11-11 MED ORDER — LANOLIN HYDROUS EX OINT: TOPICAL_OINTMENT | CUTANEOUS | Status: DC | PRN

## 2013-11-11 NOTE — Progress Notes (Signed)
Bright red blood called CNM and Dr. Ermalinda MemosBradshaw about it. Dr. Ermalinda MemosBradshaw was at the bed side to verify.

## 2013-11-11 NOTE — Progress Notes (Signed)
I have seen the patient with the resident/student and agree with the above.  Eyonna Sandstrom Donovan  

## 2013-11-11 NOTE — Lactation Note (Signed)
This note was copied from the chart of Sydney Lam. Lactation Consultation Note  Patient Name: Sydney Lam XBJYN'WToday's Date: 11/11/2013 Reason for consult: Initial assessment attempted with this mom and her newborn at 1214 hours of age.  FOB present and baby asleep in crib (mom in bathroom).  FOB speaks and states he understands English but would like LC handout in Spanish for mom.  LC provided [employer] brochure in Spanish, and reviewed WH services and list of community and web site resources. FOB to share this information with his wife.  Baby latched for 35 minutes after delivery and has breastfed once more but has been sleepy and (per FOB) also spitty, which LC discussed as normal during early hours after birth.  LC reviewed STS and cue feedings with FOB and he will discuss with wife and call for assistance as needed.  FOB states he knows how to use bulb syringe if baby spits up.   Maternal Data Formula Feeding for Exclusion: No Infant to breast within first hour of birth: Yes (LATCH score of 6 and nursed for 35 minutes) Has patient been taught Hand Expression?:  (mom in bathroom; unable to determine if she has been shown hand expression) Does the patient have breastfeeding experience prior to this delivery?: No  Feeding Feeding Type: Breast Fed Length of feed: 0 min  LATCH Score/Interventions        initial LATCH score was 6 due to need for assistance and repeated attempts to latch              Lactation Tools Discussed/Used   STS, cue feedings  Consult Status Consult Status: Follow-up Date: 11/12/13 Follow-up type: In-patient    Warrick ParisianBryant, Nailani Full [facility] 11/11/2013, 8:39 PM

## 2013-11-11 NOTE — Progress Notes (Signed)
Sydney Lam is a 38 y.o. G2P0010 at 2436w6d  admitted for induction of labor due to Low amniotic fluid..  Subjective: Feeling much better after her epidural. Still feeling pressure and more uncomfortable in the last 30 minutes.   Objective: BP 119/68  Pulse 61  Temp(Src) 98.4 F (36.9 C) (Oral)  Resp 18  Ht 5\' 3"  (1.6 m)  Wt 78.019 kg (172 lb)  BMI 30.48 kg/m2  SpO2 97%  LMP 02/04/2013   Total I/O In: -  Out: 200 [Urine:200]  FHT:  FHR: 145 bpm, variability: moderate,  accelerations:  Abscent,  decelerations:  Present variable decels UC:   irregular, every 1-3 minutes SVE:   Dilation: 6.5 Effacement (%): 100 Station: -1 Exam by:: Sydney Lam  Labs: Lab Results  Component Value Date   WBC 9.7 11/10/2013   HGB 13.3 11/10/2013   HCT 36.8 11/10/2013   MCV 89.8 11/10/2013   PLT 180 11/10/2013    Assessment / Plan: Induction of labor due to oligohydramnios ,  progressing well on pitocin  Labor: Progressing on Pitocin, will continue to increase then AROM Preeclampsia:  no signs or symptoms of toxicity Fetal Wellbeing:  Category II Pain Control:  Epidural I/D:  n/a Anticipated MOD:  NSVD  [name], [name] 11/11/2013, 2:11 AM

## 2013-11-11 NOTE — Progress Notes (Signed)
Sydney Martinezatricia Abigail Hernandez-Sandoval is a 38 y.o. G2P0010 at 4676w6d  admitted for induction of labor due to Low amniotic fluid..  Subjective: Feeling lots of pressure and sharp pains.   Objective: BP 137/76  Pulse 84  Temp(Src) 98.4 F (36.9 C) (Oral)  Resp 20  Ht 5\' 3"  (1.6 m)  Wt 78.019 kg (172 lb)  BMI 30.48 kg/m2  SpO2 97%  LMP 02/04/2013   Total I/O In: -  Out: 200 [Urine:200]  FHT:  FHR: 150 bpm, variability: moderate,  accelerations:  Abscent,  decelerations:  Present variable decels UC:   irregular, every 3-4 minutes SVE:   Dilation: 10 Effacement (%): 100 Station: +1 Exam by:: Dr. Ermalinda MemosBradshaw  Labs: Lab Results  Component Value Date   WBC 9.7 11/10/2013   HGB 13.3 11/10/2013   HCT 36.8 11/10/2013   MCV 89.8 11/10/2013   PLT 180 11/10/2013    Assessment / Plan: Induction of labor due to oligohydramnios ,  progressing well on pitocin  Labor: progressing, decrease pitocin due to increased vaginal bleeding, pushed X 3 without much progress so will labor down and monitor closely.  Preeclampsia:  no signs or symptoms of toxicity Fetal Wellbeing:  Category II Pain Control:  Epidural I/D:  n/a Anticipated MOD:  NSVD  Decreased pitocin now down to 10  [name], Krystyne Tewksbury 11/11/2013, 4:12 AM

## 2013-11-11 NOTE — Progress Notes (Signed)
I have seen the patient with the resident/student and agree with the above.  Hogan, Heather Donovan  

## 2013-11-12 ENCOUNTER — Encounter: Payer: Self-pay | Admitting: *Deleted

## 2013-11-12 LAB — CBC
HEMATOCRIT: 33.2 % — AB (ref 36.0–46.0)
Hemoglobin: 11.4 g/dL — ABNORMAL LOW (ref 12.0–15.0)
MCH: 31.2 pg (ref 26.0–34.0)
MCHC: 34.3 g/dL (ref 30.0–36.0)
MCV: 91 fL (ref 78.0–100.0)
Platelets: 159 10*3/uL (ref 150–400)
RBC: 3.65 MIL/uL — ABNORMAL LOW (ref 3.87–5.11)
RDW: 13.1 % (ref 11.5–15.5)
WBC: 10 10*3/uL (ref 4.0–10.5)

## 2013-11-12 MED ORDER — IBUPROFEN 600 MG PO TABS: 600.0000 mg | ORAL_TABLET | Freq: Four times a day (QID) | ORAL | Status: AC

## 2013-11-12 MED ORDER — NORETHINDRONE 0.35 MG PO TABS: 1.0000 | ORAL_TABLET | Freq: Every day | ORAL | Status: AC

## 2013-11-12 NOTE — Lactation Note (Signed)
This note was copied from the chart of [name]. Lactation Consultation Note Follow up consult:  Baby 31 hours old and sleepy at breast. Undressed baby to diaper and changed stool diaper. Mother was able to hand express few drops of colostrum.  Assisted mother to place baby in cross cradle hold.  With compression, mother was able to latch baby, active sucking observed and with a few swallows.  Baby breastfed for 10 min, sleepy and unable to relatch at this time.  Baby not showing further feeding cues.  Will follow up with mother.   Patient Name: [name] UJWJX'BToday's Date: 11/12/2013 Reason for consult: Follow-up assessment   Maternal Data Has patient been taught Hand Expression?: Yes  Feeding Feeding Type: Breast Fed Length of feed: 12 min  LATCH Score/Interventions Latch: Repeated attempts needed to sustain latch, nipple held in mouth throughout feeding, stimulation needed to elicit sucking reflex. Intervention(s): Breast massage;Assist with latch;Adjust position  Audible Swallowing: A few with stimulation  Type of Nipple: Everted at rest and after stimulation  Comfort (Breast/Nipple): Soft / non-tender     Hold (Positioning): Assistance needed to correctly position infant at breast and maintain latch. Intervention(s): Support Pillows;Skin to skin  LATCH Score: 7  Lactation Tools Discussed/Used     Consult Status Consult Status: Follow-up Date: 11/13/13 Follow-up type: In-patient    Dahlia ByesBerkelhammer, Ruth [facility] 11/12/2013, 1:25 PM

## 2013-11-12 NOTE — Discharge Summary (Signed)
Obstetric Discharge Summary Reason for Admission: induction of labor Prenatal Procedures: none Intrapartum Procedures: spontaneous vaginal delivery Postpartum Procedures: none Complications-Operative and Postpartum: none Hemoglobin  Date Value Ref Range Status  11/12/2013 11.4* 12.0 - 15.0 g/dL Final     HCT  Date Value Ref Range Status  11/12/2013 33.2* 36.0 - 46.0 % Final   Hospital Course: no complications. SVD at 0540 on 11/11/13, IOL for oligo. Patient is breastfeeding and desires POPs on discharge. [place] and will get Mirena there.   Physical Exam:  General: alert, cooperative and no distress Lochia: appropriate Uterine Fundus: firm Incision: n/a DVT Evaluation: No evidence of DVT seen on physical exam. No significant calf/ankle edema.  Discharge Diagnoses: Term Pregnancy-delivered  Discharge Information: Date: 11/12/2013 Activity: pelvic rest Diet: routine Medications: Ibuprofen and Colace Condition: stable Instructions: refer to practice specific booklet Discharge to: home Follow-up Information   Follow up with [facility] In 4 weeks.   Contact information:   [address] [place] [id number]       Newborn Data: Live born female  Birth Weight: 6 lb 3.8 oz (2829 g) APGAR: 8, 9  Home with mother.  Duane Bostonuke, Angela N 11/12/2013, 7:48 AM  I have seen and examined this patient and agree with above documentation in the PA student's note.   Rulon AbideKeli Lynel Forester, M.D. [facility] 11/12/2013 10:58 AM

## 2013-11-12 NOTE — Discharge Summary (Signed)
Attestation of Attending Supervision of Fellow: Evaluation and management procedures were performed by the Fellow under my supervision and collaboration.  I have reviewed the Fellow's note and chart, and I agree with the management and plan.    

## 2013-11-12 NOTE — Anesthesia Postprocedure Evaluation (Signed)
Anesthesia Post Note  Patient: Sydney Lam  Procedure(s) Performed: * No procedures listed *  Anesthesia type: Epidural  Patient location: Mother/Baby  Post pain: Pain level controlled  Post assessment: Post-op Vital signs reviewed  Last Vitals:  Filed Vitals:   11/12/13 0544  BP: 113/66  Pulse: 70  Temp: 37.1 C  Resp: 20    Post vital signs: Reviewed  Level of consciousness: awake  Complications: No apparent anesthesia complications

## 2013-11-12 NOTE — Discharge Instructions (Signed)

## 2013-11-12 NOTE — Progress Notes (Signed)
CSW referral received to assess reason for LPNC @ 33 weeks. Per chart review, pts care was transferred from [place] at 28 week. Prenatal records scanned in the chart. CSW will continue monitor drug screen results however intervention will not be provided at this time. Please reconsult if needed.      

## 2013-11-13 ENCOUNTER — Ambulatory Visit: Payer: Self-pay

## 2013-11-13 ENCOUNTER — Encounter: Payer: 59 | Admitting: Obstetrics & Gynecology

## 2013-11-13 NOTE — Lactation Note (Addendum)
This note was copied from the chart of Sydney Maren ReamerPatricia Lam. Lactation Consultation Note Follow up consult:  Interpreter present.  Baby 53 hours old and sleeping.  Encouraged mother to use waking techniques to breastfeed for longer periods of time.  Reviewed feeding baby 8-12 times a day, Baby & Me booklet chart for voids/stools per day of life, engorgement care, lactation support services.  Provided mother with hand pump.  Encouraged mother to call for further assistance if needed.   Patient Name: Sydney Maren Reameratricia Lam WUJWJ'XToday's Date: 11/13/2013 Reason for consult: Follow-up assessment   Maternal Data Formula Feeding for Exclusion: No  Feeding Feeding Type: Breast Fed Length of feed: 20 min  LATCH Score/Interventions Latch: Repeated attempts needed to sustain latch, nipple held in mouth throughout feeding, stimulation needed to elicit sucking reflex. Intervention(s): Adjust position;Assist with latch  Audible Swallowing: Spontaneous and intermittent  Type of Nipple: Everted at rest and after stimulation  Comfort (Breast/Nipple): Soft / non-tender     Hold (Positioning): Assistance needed to correctly position infant at breast and maintain latch.  LATCH Score: 8  Lactation Tools Discussed/Used     Consult Status Consult Status: PRN    Dahlia ByesBerkelhammer, Emillie Chasen [facility] 11/13/2013, 11:19 AM

## 2013-11-18 NOTE — H&P (Signed)
Attestation of Attending Supervision of Fellow: Evaluation and management procedures were performed by the Fellow under my supervision and collaboration.  I have reviewed the Fellow's note and chart, and I agree with the management and plan.    

## 2013-11-20 ENCOUNTER — Ambulatory Visit: Payer: Self-pay

## 2013-11-20 NOTE — Lactation Note (Signed)
This note was copied from the chart of Sydney Lam. Infant Lactation Consultation Outpatient Visit Note  Patient Name: Sydney Lam Date of Birth: 11/11/2014 Birth Weight:  6 lb 3.8 oz (2829 g) Gestational Age at Delivery: Gestational Age: 383w0d Type of Delivery: NVD DISCHARGE WEIGHT: 5-12.7(7%) WEIGHT TODAY: 6-3.7 Breastfeeding History Frequency of Breastfeeding: EVERY 3 HOURS BOTH BREASTS Length of Feeding: 10 MINUTES Voids: QS Stools: QS YELLOW  Supplementing / Method:FORMULA 2 OZ AFTER BREASTFEEDING Pumping:  Type of Pump:MANUAL/EVEN FLOW ELECTRIC   Frequency:TWICE PER DAY  Volume:  4 OZ LEFT/NONE FROM RIGHT  Comments:    Consultation Evaluation:Mom here with 811 week old infant for feeding assessment.  Mom started giving formula on day 3 because she did not think baby was getting enough milk and acting hungry.  Both breasts full this am.  Observed baby latch very well to breasts.  Demonstrated waking techniques and breast massage/compression to increase flow of milk.  Baby nursed for 20 minutes on left breast and transferred 36 mls.  Breast softening noted.  Baby unable to sustain latch on right due to fullness so 24 mm nipples shield used to assist baby.  Baby was able to latch and nurse on and off for 10 minutes.  Nipple shield full of milk.  Mom post pumped with SYMPHONY pump and obtained 50 mls from right and 40 mls from left.  Reassured mom she does have supply but baby has not been effective enough for adequate milk transfer.  Discussed pump rental but they will try to use even flow and call if needed.  Plan discussed with parents to breastfeed on cue, then post pump x 15 minutes and offer EBM if baby still acts hungry.  Parents know to use good waking techniques and breast massage.  Initial Feeding Assessment: Pre-feed Weight: Post-feed Weight: Amount Transferred: Comments:  Additional Feeding Assessment: Pre-feed [id number]:2826 Post-feed  [id number]:2862 Amount Transferred:36 MLS Comments:  Additional Feeding Assessment: Pre-feed Weight:2830 AFTER DIAPER CHANGE Post-feed [id number]:2832 Amount Transferred:2 MLS Comments:  Total Breast milk Transferred this Visit: 38 MLS Total Supplement Given: 40 EBM  Additional Interventions:   Follow-Up Outpatient appointment 11/24/13 1030      Hansel Feinsteinowell, Kimorah Ridolfi Ann 11/20/2013, 9:52 AM

## 2013-12-03 ENCOUNTER — Encounter: Payer: Self-pay | Admitting: *Deleted

## 2013-12-25 ENCOUNTER — Ambulatory Visit: Payer: Managed Care, Other (non HMO) | Admitting: Family Medicine

## 2014-07-20 ENCOUNTER — Encounter (HOSPITAL_COMMUNITY): Payer: Self-pay | Admitting: General Practice
